# Patient Record
Sex: Female | Born: 1960 | Race: White | Hispanic: No | Smoking: Former smoker
Health system: Southern US, Community
[De-identification: ages and names within clinical notes are randomized; demographics above are authoritative.]

---

## 1999-07-12 ENCOUNTER — Encounter: Payer: Self-pay | Admitting: Gynecology

## 1999-07-12 ENCOUNTER — Other Ambulatory Visit: Admission: RE | Admit: 1999-07-12 | Discharge: 1999-07-12 | Payer: Self-pay | Admitting: Gynecology

## 1999-07-12 ENCOUNTER — Encounter: Admission: RE | Admit: 1999-07-12 | Discharge: 1999-07-12 | Payer: Self-pay | Admitting: Gynecology

## 1999-10-27 ENCOUNTER — Encounter: Payer: Self-pay | Admitting: *Deleted

## 1999-10-27 ENCOUNTER — Ambulatory Visit (HOSPITAL_COMMUNITY): Admission: RE | Admit: 1999-10-27 | Discharge: 1999-10-27 | Payer: Self-pay | Admitting: *Deleted

## 2003-02-24 ENCOUNTER — Other Ambulatory Visit: Admission: RE | Admit: 2003-02-24 | Discharge: 2003-02-24 | Payer: Self-pay | Admitting: Gynecology

## 2003-02-24 ENCOUNTER — Encounter: Admission: RE | Admit: 2003-02-24 | Discharge: 2003-02-24 | Payer: Self-pay | Admitting: Gynecology

## 2003-03-27 ENCOUNTER — Emergency Department (HOSPITAL_COMMUNITY): Admission: EM | Admit: 2003-03-27 | Discharge: 2003-03-27 | Payer: Self-pay | Admitting: Emergency Medicine

## 2005-04-20 ENCOUNTER — Other Ambulatory Visit: Admission: RE | Admit: 2005-04-20 | Discharge: 2005-04-20 | Payer: Self-pay | Admitting: Gynecology

## 2007-02-03 ENCOUNTER — Emergency Department (HOSPITAL_COMMUNITY): Admission: EM | Admit: 2007-02-03 | Discharge: 2007-02-03 | Payer: Self-pay | Admitting: Family Medicine

## 2007-02-28 ENCOUNTER — Ambulatory Visit: Payer: Self-pay | Admitting: Licensed Clinical Social Worker

## 2007-03-07 ENCOUNTER — Ambulatory Visit: Payer: Self-pay | Admitting: Licensed Clinical Social Worker

## 2007-03-14 ENCOUNTER — Ambulatory Visit: Payer: Self-pay | Admitting: Licensed Clinical Social Worker

## 2007-03-20 ENCOUNTER — Emergency Department (HOSPITAL_COMMUNITY): Admission: EM | Admit: 2007-03-20 | Discharge: 2007-03-20 | Payer: Self-pay | Admitting: Emergency Medicine

## 2007-03-20 ENCOUNTER — Ambulatory Visit: Payer: Self-pay | Admitting: Licensed Clinical Social Worker

## 2007-03-21 ENCOUNTER — Other Ambulatory Visit (HOSPITAL_COMMUNITY): Admission: RE | Admit: 2007-03-21 | Discharge: 2007-06-19 | Payer: Self-pay | Admitting: Psychiatry

## 2007-03-21 ENCOUNTER — Ambulatory Visit: Payer: Self-pay | Admitting: Psychiatry

## 2011-01-17 LAB — RAPID URINE DRUG SCREEN, HOSP PERFORMED
Amphetamines: NOT DETECTED
Barbiturates: NOT DETECTED
Benzodiazepines: POSITIVE — AB
Cocaine: NOT DETECTED
Opiates: NOT DETECTED
Tetrahydrocannabinol: NOT DETECTED

## 2011-01-17 LAB — BASIC METABOLIC PANEL
Calcium: 9.9
Chloride: 104
Creatinine, Ser: 0.81
GFR calc Af Amer: >60

## 2011-01-17 LAB — URINALYSIS, ROUTINE W REFLEX MICROSCOPIC
Bilirubin Urine: NEGATIVE
Nitrite: NEGATIVE
Specific Gravity, Urine: 1.005
pH: 6

## 2011-01-17 LAB — DIFFERENTIAL
Lymphocytes Relative: 30
Lymphs Abs: 3
Monocytes Relative: 7
Neutro Abs: 6.2
Neutrophils Relative %: 61

## 2011-01-17 LAB — ETHANOL: Alcohol, Ethyl (B): <5

## 2011-01-17 LAB — PREGNANCY, URINE: Preg Test, Ur: NEGATIVE

## 2011-01-17 LAB — TRICYCLICS SCREEN, URINE: TCA Scrn: NOT DETECTED

## 2011-01-17 LAB — CBC
RBC: 5.04
WBC: 10.1

## 2011-01-19 LAB — BASIC METABOLIC PANEL
CO2: 22
Calcium: 9.6
Chloride: 109
GFR calc Af Amer: >60
Sodium: 140

## 2011-01-19 LAB — CBC
Hemoglobin: 14
MCHC: 34.3
MCV: 90.3
RBC: 4.54

## 2011-01-19 LAB — URINALYSIS, ROUTINE W REFLEX MICROSCOPIC
Glucose, UA: NEGATIVE
Hgb urine dipstick: NEGATIVE
Specific Gravity, Urine: 1.004 — ABNORMAL LOW

## 2011-05-31 ENCOUNTER — Other Ambulatory Visit: Payer: Self-pay | Admitting: Obstetrics and Gynecology

## 2011-05-31 ENCOUNTER — Ambulatory Visit
Admission: RE | Admit: 2011-05-31 | Discharge: 2011-05-31 | Disposition: A | Discharge status: Home / Self Care | Payer: BC Managed Care – PPO | Source: Ambulatory Visit | Attending: Obstetrics and Gynecology | Admitting: Obstetrics and Gynecology

## 2011-05-31 DIAGNOSIS — R928 Other abnormal and inconclusive findings on diagnostic imaging of breast: Secondary | ICD-10-CM

## 2011-08-26 ENCOUNTER — Other Ambulatory Visit: Payer: Self-pay | Admitting: Family Medicine

## 2011-08-26 DIAGNOSIS — R42 Dizziness and giddiness: Secondary | ICD-10-CM

## 2011-08-27 ENCOUNTER — Other Ambulatory Visit: Payer: BC Managed Care – PPO

## 2016-09-09 DIAGNOSIS — E559 Vitamin D deficiency, unspecified: Secondary | ICD-10-CM | POA: Diagnosis not present

## 2016-09-09 DIAGNOSIS — Z0000 Encounter for general adult medical examination without abnormal findings: Secondary | ICD-10-CM | POA: Diagnosis not present

## 2016-09-14 DIAGNOSIS — Z0001 Encounter for general adult medical examination with abnormal findings: Secondary | ICD-10-CM | POA: Diagnosis not present

## 2016-09-14 DIAGNOSIS — M2550 Pain in unspecified joint: Secondary | ICD-10-CM | POA: Diagnosis not present

## 2016-09-14 DIAGNOSIS — R5382 Chronic fatigue, unspecified: Secondary | ICD-10-CM | POA: Diagnosis not present

## 2016-09-19 DIAGNOSIS — E0590 Thyrotoxicosis, unspecified without thyrotoxic crisis or storm: Secondary | ICD-10-CM | POA: Diagnosis not present

## 2016-09-23 DIAGNOSIS — M2550 Pain in unspecified joint: Secondary | ICD-10-CM | POA: Diagnosis not present

## 2016-09-23 DIAGNOSIS — H04129 Dry eye syndrome of unspecified lacrimal gland: Secondary | ICD-10-CM | POA: Diagnosis not present

## 2016-09-23 DIAGNOSIS — M79641 Pain in right hand: Secondary | ICD-10-CM | POA: Diagnosis not present

## 2016-09-23 DIAGNOSIS — M79672 Pain in left foot: Secondary | ICD-10-CM | POA: Diagnosis not present

## 2016-09-23 DIAGNOSIS — R5383 Other fatigue: Secondary | ICD-10-CM | POA: Diagnosis not present

## 2016-10-04 DIAGNOSIS — M1990 Unspecified osteoarthritis, unspecified site: Secondary | ICD-10-CM | POA: Diagnosis not present

## 2016-10-04 DIAGNOSIS — M25512 Pain in left shoulder: Secondary | ICD-10-CM | POA: Diagnosis not present

## 2016-10-04 DIAGNOSIS — M2550 Pain in unspecified joint: Secondary | ICD-10-CM | POA: Diagnosis not present

## 2016-10-04 DIAGNOSIS — R5383 Other fatigue: Secondary | ICD-10-CM | POA: Diagnosis not present

## 2016-10-08 DIAGNOSIS — Z1211 Encounter for screening for malignant neoplasm of colon: Secondary | ICD-10-CM | POA: Diagnosis not present

## 2016-10-08 DIAGNOSIS — Z1212 Encounter for screening for malignant neoplasm of rectum: Secondary | ICD-10-CM | POA: Diagnosis not present

## 2016-11-15 DIAGNOSIS — E0590 Thyrotoxicosis, unspecified without thyrotoxic crisis or storm: Secondary | ICD-10-CM | POA: Diagnosis not present

## 2016-11-30 DIAGNOSIS — G4710 Hypersomnia, unspecified: Secondary | ICD-10-CM | POA: Diagnosis not present

## 2016-11-30 DIAGNOSIS — R0602 Shortness of breath: Secondary | ICD-10-CM | POA: Diagnosis not present

## 2016-11-30 DIAGNOSIS — Z780 Asymptomatic menopausal state: Secondary | ICD-10-CM | POA: Diagnosis not present

## 2016-12-07 DIAGNOSIS — G4710 Hypersomnia, unspecified: Secondary | ICD-10-CM | POA: Diagnosis not present

## 2016-12-22 DIAGNOSIS — Z780 Asymptomatic menopausal state: Secondary | ICD-10-CM | POA: Diagnosis not present

## 2016-12-22 DIAGNOSIS — G4733 Obstructive sleep apnea (adult) (pediatric): Secondary | ICD-10-CM | POA: Diagnosis not present

## 2017-01-18 DIAGNOSIS — Z01419 Encounter for gynecological examination (general) (routine) without abnormal findings: Secondary | ICD-10-CM | POA: Diagnosis not present

## 2017-01-25 DIAGNOSIS — R5383 Other fatigue: Secondary | ICD-10-CM | POA: Diagnosis not present

## 2017-03-22 ENCOUNTER — Ambulatory Visit (INDEPENDENT_AMBULATORY_CARE_PROVIDER_SITE_OTHER): Payer: 59 | Admitting: Orthopedic Surgery

## 2017-03-22 ENCOUNTER — Ambulatory Visit (INDEPENDENT_AMBULATORY_CARE_PROVIDER_SITE_OTHER): Payer: 59

## 2017-03-22 ENCOUNTER — Encounter (INDEPENDENT_AMBULATORY_CARE_PROVIDER_SITE_OTHER): Payer: Self-pay | Admitting: Orthopedic Surgery

## 2017-03-22 DIAGNOSIS — M792 Neuralgia and neuritis, unspecified: Secondary | ICD-10-CM

## 2017-03-22 DIAGNOSIS — G8929 Other chronic pain: Secondary | ICD-10-CM

## 2017-03-22 DIAGNOSIS — M25511 Pain in right shoulder: Secondary | ICD-10-CM | POA: Diagnosis not present

## 2017-03-22 NOTE — Progress Notes (Signed)
Office Visit Note   Patient: Stacy Lawrence           Date of Birth: 1960-09-07           MRN: [id number] Visit Date: 03/22/2017 Requested by: No referring provider defined for this encounter. PCP: Deland Pretty, MD  Subjective: Chief Complaint  Patient presents with  . Right Shoulder - Pain    HPI: Stacy Lawrence is a 56 year old female with several year history of right shoulder pain.  She denies any discrete history of injury but does report that her symptoms started when she was working out with a Physiological scientist.  She has had 2 cortisone injections in the past.  One was last year which lasted for 8 months.  The second injection this year only lasted 2 months.  Patient states that the pain comes and goes.  She localizes it to the anterior deltoid region.  She denies any neck pain or any numbness and tingling but does state that the pain is improved at night by putting her arm in the overhead position.  She has had a course of physical therapy.  She has seen a rheumatologist.  She can go several days without pain but then she has pain for several days in a row.  He does hurt her with overhead motion.  She does do office type work.  She does report occasional popping in the shoulder              ROS: All systems reviewed are negative as they relate to the chief complaint within the history of present illness.  Patient denies  fevers or chills.   Assessment & Plan: Visit Diagnoses:  1. Chronic right shoulder pain   2. Radicular pain in right arm     Plan: Impression is right shoulder pain with positive O'Brien's testing but good strength and no evidence of frozen shoulder.  I think this may represent labral pathology or rotator cuff pathology.  Alternatively there is a small chance this could be early radiculopathy based on improvement with overhead placement at night.  She has had this for 2 years and has failed a course of conservative treatment.  Did have positive response on 2  occasions to shoulder injection.  Plan at this time is MRI arthrogram to evaluate the labrum biceps tendon and rotator cuff.  If that is negative we need to proceed with workup of the neck.  Follow-Up Instructions: Return for after MRI.   Orders:  Orders Placed This Encounter  Procedures  . XR Cervical Spine 2 or 3 views  . XR Shoulder Right  . Arthrogram  . MR SHOULDER RIGHT W CONTRAST   No orders of the defined types were placed in this encounter.     Procedures: No procedures performed   Clinical Data: No additional findings.  Objective: Vital Signs: There were no vitals taken for this visit.  Physical Exam:   Constitutional: Patient appears well-developed HEENT:  Head: Normocephalic Eyes:EOM are normal Neck: Normal range of motion Cardiovascular: Normal rate Pulmonary/chest: Effort normal Neurologic: Patient is alert Skin: Skin is warm Psychiatric: Patient has normal mood and affect    Ortho Exam: Patient has good cervical spine range of motion and no paresthesias in the arms.  Radial pulse intact bilaterally.  Patient has 5+ out of 5 grip EPL FPL interosseous wrist flexion wrist extension biceps triceps and deltoid strength.  No restriction of external rotation at 15 degrees of abduction.  No masses lymphadenopathy or  skin changes noted in the right shoulder girdle region.  Patient has excellent rotator cuff strength to isolated infraspinatus supraspinatus and subscap muscle testing.  Patient has full active and passive range of motion.  Specialty Comments:  No specialty comments available.  Imaging: Xr Cervical Spine 2 Or 3 Views  Result Date: 03/22/2017 AP lateral cervical spine reviewed.  Normal lordosis present.  No degenerative disc disease seen.  No facet arthritis present.  Normal cervical spine  Xr Shoulder Right  Result Date: 03/22/2017 AP outlet and axillary view right shoulder reviewed.  Visualized lung fields normal.  Acromioclavicular joint  normal.  Normal acromiohumeral distance present.  No arthritis seen.  Normal right shoulder    PMFS History: There are no active problems to display for this patient.  History reviewed. No pertinent past medical history.  History reviewed. No pertinent family history.  History reviewed. No pertinent surgical history. Social History   Occupational History  . Not on file  Tobacco Use  . Smoking status: Former Research scientist (life sciences)  . Smokeless tobacco: Never Used  Substance and Sexual Activity  . Alcohol use: Not on file  . Drug use: Not on file  . Sexual activity: Not on file

## 2017-04-20 ENCOUNTER — Ambulatory Visit
Admission: RE | Admit: 2017-04-20 | Discharge: 2017-04-20 | Disposition: A | Discharge status: Home / Self Care | Payer: 59 | Source: Ambulatory Visit | Attending: Orthopedic Surgery | Admitting: Orthopedic Surgery

## 2017-04-20 DIAGNOSIS — M25511 Pain in right shoulder: Secondary | ICD-10-CM | POA: Diagnosis not present

## 2017-04-20 DIAGNOSIS — S43431A Superior glenoid labrum lesion of right shoulder, initial encounter: Secondary | ICD-10-CM | POA: Diagnosis not present

## 2017-04-20 DIAGNOSIS — G8929 Other chronic pain: Secondary | ICD-10-CM

## 2017-04-20 MED ORDER — IOPAMIDOL (ISOVUE-M 200) INJECTION 41%
15.0000 mL | Freq: Once | INTRAMUSCULAR | Status: AC
  Administered 2017-04-20: 15 mL via INTRA_ARTICULAR

## 2017-04-25 ENCOUNTER — Telehealth (INDEPENDENT_AMBULATORY_CARE_PROVIDER_SITE_OTHER): Payer: Self-pay | Admitting: Orthopedic Surgery

## 2017-04-25 NOTE — Telephone Encounter (Signed)
Patient called asking if she could get a call with her MRI results. She didn't want to wait til next Wednesday for them. CB # [phone]

## 2017-04-25 NOTE — Telephone Encounter (Signed)
Please advise. Thanks.  

## 2017-04-26 ENCOUNTER — Telehealth (INDEPENDENT_AMBULATORY_CARE_PROVIDER_SITE_OTHER): Payer: Self-pay | Admitting: Orthopedic Surgery

## 2017-04-26 NOTE — Telephone Encounter (Signed)
Please advise. Thanks.  

## 2017-04-26 NOTE — Telephone Encounter (Signed)
Patient called again for MRI results.

## 2017-04-26 NOTE — Telephone Encounter (Signed)
Patient called saying she received Dr. Forbes Cellar voicemail with her results from her MRI but wanted to speak with him about her next options. CB # [phone]

## 2017-04-26 NOTE — Telephone Encounter (Signed)
I called left message

## 2017-04-27 ENCOUNTER — Telehealth (INDEPENDENT_AMBULATORY_CARE_PROVIDER_SITE_OTHER): Payer: Self-pay

## 2017-04-27 NOTE — Telephone Encounter (Signed)
Patient was returning Dr. Randel Pigg call concerning her MRI results.  Cb# is [phone].  Please advise. Thank you.

## 2017-04-27 NOTE — Telephone Encounter (Signed)
Please advise 

## 2017-04-28 NOTE — Telephone Encounter (Signed)
Please schedule her sometime in the future for shoulder injection intra-articular thanks

## 2017-04-28 NOTE — Telephone Encounter (Signed)
IC appt scheduled.  

## 2017-05-10 ENCOUNTER — Encounter (INDEPENDENT_AMBULATORY_CARE_PROVIDER_SITE_OTHER): Payer: Self-pay | Admitting: Orthopedic Surgery

## 2017-05-10 ENCOUNTER — Ambulatory Visit (INDEPENDENT_AMBULATORY_CARE_PROVIDER_SITE_OTHER): Payer: 59 | Admitting: Orthopedic Surgery

## 2017-05-10 DIAGNOSIS — S43431A Superior glenoid labrum lesion of right shoulder, initial encounter: Secondary | ICD-10-CM | POA: Diagnosis not present

## 2017-05-10 MED ORDER — BUPIVACAINE HCL 0.5 % IJ SOLN
9.0000 mL | INTRAMUSCULAR | Status: AC | PRN
  Administered 2017-05-10: 9 mL via INTRA_ARTICULAR

## 2017-05-10 MED ORDER — LIDOCAINE HCL 1 % IJ SOLN
5.0000 mL | INTRAMUSCULAR | Status: AC | PRN
  Administered 2017-05-10: 5 mL

## 2017-05-10 MED ORDER — METHYLPREDNISOLONE ACETATE 40 MG/ML IJ SUSP
40.0000 mg | INTRAMUSCULAR | Status: AC | PRN
  Administered 2017-05-10: 40 mg via INTRA_ARTICULAR

## 2017-05-10 NOTE — Progress Notes (Signed)
Office Visit Note   Patient: Stacy Lawrence           Date of Birth: Jul 01, 1960           MRN: [id number] Visit Date: 05/10/2017 Requested by: Deland Pretty, MD [address] [place], [place] [address] PCP: Deland Pretty, MD  Subjective: Chief Complaint  Patient presents with  . Right Shoulder - Follow-up    HPI: Patient presents for follow-up of right shoulder pain.  Had MRI scan earlier this year.  MRI scan shows SLAP tear and bursitis.  She is having some symptoms and mechanical symptoms in the shoulder when she tries to work out.  She has had 2 prior injections which sound like subacromial injections with marginal relief.  She is considering surgery versus another injection she is taking over-the-counter medications with some relief.              ROS: All systems reviewed are negative as they relate to the chief complaint within the history of present illness.  Patient denies  fevers or chills.   Assessment & Plan: Visit Diagnoses: No diagnosis found.  Plan: Impression is right shoulder pain with SLAP tear and mechanical symptoms on exam.  Plan is right shoulder injection today into the glenohumeral joint.  We will see how she does with symptoms.  I will see her back in 8 weeks for clinical recheck and decision for or against biceps tenodesis at that time.  The scan is reviewed with the patient and I discussed arthroscopic and open biceps tenodesis with her.  Follow-Up Instructions: No Follow-up on file.   Orders:  No orders of the defined types were placed in this encounter.  No orders of the defined types were placed in this encounter.     Procedures: Large Joint Inj: R glenohumeral on 05/10/2017 4:51 PM Indications: diagnostic evaluation and pain Details: 18 G 1.5 in needle, posterior approach  Arthrogram: No  Medications: 9 mL bupivacaine 0.5 %; 40 mg methylPREDNISolone acetate 40 MG/ML; 5 mL lidocaine 1 % Outcome: tolerated well, no immediate  complications Procedure, treatment alternatives, risks and benefits explained, specific risks discussed. Consent was given by the patient. Immediately prior to procedure a time out was called to verify the correct patient, procedure, equipment, support staff and site/side marked as required. Patient was prepped and draped in the usual sterile fashion.       Clinical Data: No additional findings.  Objective: Vital Signs: There were no vitals taken for this visit.  Physical Exam:   Constitutional: Patient appears well-developed HEENT:  Head: Normocephalic Eyes:EOM are normal Neck: Normal range of motion Cardiovascular: Normal rate Pulmonary/chest: Effort normal Neurologic: Patient is alert Skin: Skin is warm Psychiatric: Patient has normal mood and affect    Ortho Exam: Orthopedic exam demonstrates full active and passive range of motion of the shoulder with positive O'Brien's testing on the right along with negative speeds testing.  No discrete AC joint tenderness is present.  No other masses lymphadenopathy or skin changes noted in the right shoulder girdle region.  Rotator cuff strength is intact.  Specialty Comments:  No specialty comments available.  Imaging: No results found.   PMFS History: There are no active problems to display for this patient.  History reviewed. No pertinent past medical history.  History reviewed. No pertinent family history.  History reviewed. No pertinent surgical history. Social History   Occupational History  . Not on file  Tobacco Use  . Smoking status: Former  Smoker  . Smokeless tobacco: Never Used  Substance and Sexual Activity  . Alcohol use: Not on file  . Drug use: Not on file  . Sexual activity: Not on file

## 2017-06-05 ENCOUNTER — Telehealth (INDEPENDENT_AMBULATORY_CARE_PROVIDER_SITE_OTHER): Payer: Self-pay | Admitting: Orthopedic Surgery

## 2017-06-05 NOTE — Telephone Encounter (Signed)
Please advise injection done end of January and office note said return in 8 weeks to decide for/against surgery.

## 2017-06-05 NOTE — Telephone Encounter (Signed)
Patient called saying that the injection hasn't helped with the pain and was wondering what her next option is? Appointment/Surgery/etc? CB # [phone]

## 2017-06-06 NOTE — Telephone Encounter (Signed)
Patient called to check on message from yesterday, she said she would like to proceed with the surgery and needs to know whether to make an appt in office before the surgery is scheduled. Please advise # [phone]

## 2017-06-07 NOTE — Telephone Encounter (Signed)
Please review and advise. Thanks.  

## 2017-06-08 NOTE — Telephone Encounter (Signed)
I called do this.  She still is having shoulder pain.  Had an injection 05/10/2017.  Did not help much.  She does have a SLAP tear by MRI scan.  Also has some bursitis.  Plan at this time is arthroscopy with subacromial decompression and biceps tenodesis.  We talked about the rehab which would be essentially 2 weeks in a sling but with a CPM machine.  I would not want her doing any active resistive biceps work for at least 6 weeks.  I think she could return to work after a week.  All questions answered.

## 2017-06-08 NOTE — Telephone Encounter (Signed)
Surgery sheet given to [facility] to proceed with surgery.

## 2017-06-13 ENCOUNTER — Telehealth (INDEPENDENT_AMBULATORY_CARE_PROVIDER_SITE_OTHER): Payer: Self-pay | Admitting: Orthopedic Surgery

## 2017-06-13 NOTE — Telephone Encounter (Signed)
Please advise. Thanks.  

## 2017-06-13 NOTE — Telephone Encounter (Signed)
Patient called needing to know how long will she be out of work after the surgery. Patient want to know if she will be able to work from home the week after or the following week? Patient wanted to know how long will she need to be on the CPM machine. The number to contact patient is [phone]

## 2017-06-14 NOTE — Telephone Encounter (Signed)
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

## 2017-06-14 NOTE — Telephone Encounter (Signed)
IC s/w patient and advised. She verbalized understanding.  

## 2017-06-26 ENCOUNTER — Encounter: Payer: Self-pay | Admitting: Orthopedic Surgery

## 2017-06-26 DIAGNOSIS — S43431A Superior glenoid labrum lesion of right shoulder, initial encounter: Secondary | ICD-10-CM | POA: Diagnosis not present

## 2017-06-26 DIAGNOSIS — M7551 Bursitis of right shoulder: Secondary | ICD-10-CM | POA: Diagnosis not present

## 2017-06-26 DIAGNOSIS — G8918 Other acute postprocedural pain: Secondary | ICD-10-CM | POA: Diagnosis not present

## 2017-06-28 ENCOUNTER — Telehealth (INDEPENDENT_AMBULATORY_CARE_PROVIDER_SITE_OTHER): Payer: Self-pay | Admitting: Orthopedic Surgery

## 2017-06-28 NOTE — Telephone Encounter (Signed)
That is probably related to the block and it will likely wear off with a few more days possibly 1-2 weeks

## 2017-06-28 NOTE — Telephone Encounter (Signed)
Please advise. Thanks.  

## 2017-06-28 NOTE — Telephone Encounter (Signed)
Patient called stating that her hand is numb and wants to know if that is normal.  Also, I wanted to make sure that she needed a 1 week post op and not a 2 week.  Patient's CB#[phone].  Thank you

## 2017-06-28 NOTE — Telephone Encounter (Signed)
IC s/w patient and advised  

## 2017-07-05 ENCOUNTER — Ambulatory Visit (INDEPENDENT_AMBULATORY_CARE_PROVIDER_SITE_OTHER): Payer: 59 | Admitting: Orthopedic Surgery

## 2017-07-05 ENCOUNTER — Encounter (INDEPENDENT_AMBULATORY_CARE_PROVIDER_SITE_OTHER): Payer: Self-pay | Admitting: Orthopedic Surgery

## 2017-07-05 DIAGNOSIS — Z9889 Other specified postprocedural states: Secondary | ICD-10-CM

## 2017-07-05 NOTE — Progress Notes (Signed)
   Office Visit Note   Patient: Stacy Lawrence           Date of Birth: 13-May-1960           MRN: [id number] Visit Date: 07/05/2017 Requested by: Deland Pretty, MD [address] [place], [place] [address] PCP: Deland Pretty, MD  Subjective: Chief Complaint  Patient presents with  . Right Shoulder - Routine Post Op    HPI: Stacy Lawrence is a 57 year old patient who is now a week out right shoulder arthroscopy with biceps tenodesis.  CPM is on 80.  Passive range of motion looks good.  Sutures removed from the portals.  Plan is no lifting with that right arm biceps in particular.  Continue CPM for another 1/2 weeks.  Start physical therapy after that.  Okay to come out of the sling.  Prescription for therapy to start in 2 weeks given.  I will see her back in 3 weeks.  Patient generally is doing well.              ROS: See above  Assessment & Plan: Visit Diagnoses:  1. S/P shoulder surgery     Plan: See above  Follow-Up Instructions: Return in about 3 weeks (around 07/26/2017).   Orders:  Orders Placed This Encounter  Procedures  . Ambulatory referral to Physical Therapy   No orders of the defined types were placed in this encounter.     Procedures: No procedures performed   Clinical Data: No additional findings.  Objective: Vital Signs: There were no vitals taken for this visit.  Physical Exam: See above  Ortho Exam: See above  Specialty Comments:  No specialty comments available.  Imaging: No results found.   PMFS History: There are no active problems to display for this patient.  History reviewed. No pertinent past medical history.  History reviewed. No pertinent family history.  History reviewed. No pertinent surgical history. Social History   Occupational History  . Not on file  Tobacco Use  . Smoking status: Former Research scientist (life sciences)  . Smokeless tobacco: Never Used  Substance and Sexual Activity  . Alcohol use: Not on file  . Drug use: Not on file    . Sexual activity: Not on file

## 2017-07-17 DIAGNOSIS — S43431D Superior glenoid labrum lesion of right shoulder, subsequent encounter: Secondary | ICD-10-CM | POA: Diagnosis not present

## 2017-07-20 DIAGNOSIS — S43431D Superior glenoid labrum lesion of right shoulder, subsequent encounter: Secondary | ICD-10-CM | POA: Diagnosis not present

## 2017-07-25 DIAGNOSIS — S43431D Superior glenoid labrum lesion of right shoulder, subsequent encounter: Secondary | ICD-10-CM | POA: Diagnosis not present

## 2017-07-26 ENCOUNTER — Ambulatory Visit (INDEPENDENT_AMBULATORY_CARE_PROVIDER_SITE_OTHER): Payer: 59 | Admitting: Orthopedic Surgery

## 2017-07-27 ENCOUNTER — Ambulatory Visit (INDEPENDENT_AMBULATORY_CARE_PROVIDER_SITE_OTHER): Payer: 59 | Admitting: Orthopedic Surgery

## 2017-07-27 ENCOUNTER — Encounter (INDEPENDENT_AMBULATORY_CARE_PROVIDER_SITE_OTHER): Payer: Self-pay | Admitting: Orthopedic Surgery

## 2017-07-27 DIAGNOSIS — Z9889 Other specified postprocedural states: Secondary | ICD-10-CM

## 2017-07-27 DIAGNOSIS — S43431D Superior glenoid labrum lesion of right shoulder, subsequent encounter: Secondary | ICD-10-CM | POA: Diagnosis not present

## 2017-07-29 ENCOUNTER — Encounter (INDEPENDENT_AMBULATORY_CARE_PROVIDER_SITE_OTHER): Payer: Self-pay | Admitting: Orthopedic Surgery

## 2017-07-29 NOTE — Progress Notes (Signed)
   Post-Op Visit Note   Patient: Stacy Lawrence           Date of Birth: 1960-07-02           MRN: [id number] Visit Date: 07/27/2017 PCP: Deland Pretty, MD   Assessment & Plan:  Chief Complaint:  Chief Complaint  Patient presents with  . Right Shoulder - Follow-up   Visit Diagnoses:  1. S/P shoulder surgery     Plan: G this is a patient who is now about a month out from right shoulder surgery biceps tenodesis she is doing well.  She is making slow but steady progress with getting her range of motion back.  She is in physical therapy 2 times a week.  Also doing home exercise program 3 times a day.  She has returned to desk work.  Not taking any medications.  On examination she has forward flexion and abduction both above 90 degrees.  Biceps feels stable.  Plan is to continue therapy and to continue working on range of motion final check in 6 weeks  Follow-Up Instructions: Return in about 6 weeks (around 09/07/2017).   Orders:  No orders of the defined types were placed in this encounter.  No orders of the defined types were placed in this encounter.   Imaging: No results found.  PMFS History: There are no active problems to display for this patient.  History reviewed. No pertinent past medical history.  History reviewed. No pertinent family history.  History reviewed. No pertinent surgical history. Social History   Occupational History  . Not on file  Tobacco Use  . Smoking status: Former Research scientist (life sciences)  . Smokeless tobacco: Never Used  Substance and Sexual Activity  . Alcohol use: Not on file  . Drug use: Not on file  . Sexual activity: Not on file

## 2017-07-31 DIAGNOSIS — S43431D Superior glenoid labrum lesion of right shoulder, subsequent encounter: Secondary | ICD-10-CM | POA: Diagnosis not present

## 2017-08-04 DIAGNOSIS — S43431D Superior glenoid labrum lesion of right shoulder, subsequent encounter: Secondary | ICD-10-CM | POA: Diagnosis not present

## 2017-08-10 DIAGNOSIS — S43431D Superior glenoid labrum lesion of right shoulder, subsequent encounter: Secondary | ICD-10-CM | POA: Diagnosis not present

## 2017-08-15 DIAGNOSIS — S43431D Superior glenoid labrum lesion of right shoulder, subsequent encounter: Secondary | ICD-10-CM | POA: Diagnosis not present

## 2017-08-18 DIAGNOSIS — S43431D Superior glenoid labrum lesion of right shoulder, subsequent encounter: Secondary | ICD-10-CM | POA: Diagnosis not present

## 2017-08-22 DIAGNOSIS — S43431D Superior glenoid labrum lesion of right shoulder, subsequent encounter: Secondary | ICD-10-CM | POA: Diagnosis not present

## 2017-08-25 DIAGNOSIS — S43431D Superior glenoid labrum lesion of right shoulder, subsequent encounter: Secondary | ICD-10-CM | POA: Diagnosis not present

## 2017-08-29 DIAGNOSIS — S43431D Superior glenoid labrum lesion of right shoulder, subsequent encounter: Secondary | ICD-10-CM | POA: Diagnosis not present

## 2017-09-07 ENCOUNTER — Encounter (INDEPENDENT_AMBULATORY_CARE_PROVIDER_SITE_OTHER): Payer: Self-pay | Admitting: Orthopedic Surgery

## 2017-09-07 ENCOUNTER — Ambulatory Visit (INDEPENDENT_AMBULATORY_CARE_PROVIDER_SITE_OTHER): Payer: 59 | Admitting: Orthopedic Surgery

## 2017-09-07 DIAGNOSIS — Z9889 Other specified postprocedural states: Secondary | ICD-10-CM

## 2017-09-08 DIAGNOSIS — S43431D Superior glenoid labrum lesion of right shoulder, subsequent encounter: Secondary | ICD-10-CM | POA: Diagnosis not present

## 2017-09-09 ENCOUNTER — Encounter (INDEPENDENT_AMBULATORY_CARE_PROVIDER_SITE_OTHER): Payer: Self-pay | Admitting: Orthopedic Surgery

## 2017-09-09 NOTE — Progress Notes (Signed)
   Post-Op Visit Note   Patient: Stacy Lawrence           Date of Birth: [birth year]           MRN: [id number] Visit Date: 09/07/2017 PCP: Deland Pretty, MD   Assessment & Plan:  Chief Complaint:  Chief Complaint  Patient presents with  . Right Shoulder - Routine Post Op   Visit Diagnoses:  1. S/P shoulder surgery     Plan: Stacy Lawrence is about 10 weeks out right shoulder arthroscopy with biceps tenodesis.  She is doing weights and range of motion exercises and physical therapy.  She has occasional pain but overall she is doing well.  She has functional range of motion for activities of daily living.  I would like for her to continue therapy 2-4 more weeks.  I think she is doing well.  Follow-up with me as needed.  Follow-Up Instructions: Return if symptoms worsen or fail to improve.   Orders:  No orders of the defined types were placed in this encounter.  No orders of the defined types were placed in this encounter.   Imaging: No results found.  PMFS History: There are no active problems to display for this patient.  History reviewed. No pertinent past medical history.  History reviewed. No pertinent family history.  History reviewed. No pertinent surgical history. Social History   Occupational History  . Not on file  Tobacco Use  . Smoking status: Former Research scientist (life sciences)  . Smokeless tobacco: Never Used  Substance and Sexual Activity  . Alcohol use: Not on file  . Drug use: Not on file  . Sexual activity: Not on file

## 2017-09-12 DIAGNOSIS — S43431D Superior glenoid labrum lesion of right shoulder, subsequent encounter: Secondary | ICD-10-CM | POA: Diagnosis not present

## 2017-09-18 DIAGNOSIS — Z0000 Encounter for general adult medical examination without abnormal findings: Secondary | ICD-10-CM | POA: Diagnosis not present

## 2017-09-18 DIAGNOSIS — E559 Vitamin D deficiency, unspecified: Secondary | ICD-10-CM | POA: Diagnosis not present

## 2017-09-21 DIAGNOSIS — Z0001 Encounter for general adult medical examination with abnormal findings: Secondary | ICD-10-CM | POA: Diagnosis not present

## 2017-10-03 DIAGNOSIS — N950 Postmenopausal bleeding: Secondary | ICD-10-CM | POA: Diagnosis not present

## 2017-10-11 DIAGNOSIS — N950 Postmenopausal bleeding: Secondary | ICD-10-CM | POA: Diagnosis not present

## 2017-10-11 DIAGNOSIS — N83201 Unspecified ovarian cyst, right side: Secondary | ICD-10-CM | POA: Diagnosis not present

## 2017-10-11 DIAGNOSIS — N858 Other specified noninflammatory disorders of uterus: Secondary | ICD-10-CM | POA: Diagnosis not present

## 2017-10-19 ENCOUNTER — Encounter (INDEPENDENT_AMBULATORY_CARE_PROVIDER_SITE_OTHER): Payer: Self-pay | Admitting: Orthopedic Surgery

## 2017-10-19 ENCOUNTER — Encounter (INDEPENDENT_AMBULATORY_CARE_PROVIDER_SITE_OTHER): Payer: Self-pay | Admitting: Orthopaedic Surgery

## 2018-01-23 DIAGNOSIS — Z01419 Encounter for gynecological examination (general) (routine) without abnormal findings: Secondary | ICD-10-CM | POA: Diagnosis not present

## 2018-01-23 DIAGNOSIS — Z6826 Body mass index (BMI) 26.0-26.9, adult: Secondary | ICD-10-CM | POA: Diagnosis not present

## 2018-01-29 DIAGNOSIS — L821 Other seborrheic keratosis: Secondary | ICD-10-CM | POA: Diagnosis not present

## 2018-01-29 DIAGNOSIS — D225 Melanocytic nevi of trunk: Secondary | ICD-10-CM | POA: Diagnosis not present

## 2018-01-29 DIAGNOSIS — L814 Other melanin hyperpigmentation: Secondary | ICD-10-CM | POA: Diagnosis not present

## 2018-02-27 DIAGNOSIS — N393 Stress incontinence (female) (male): Secondary | ICD-10-CM | POA: Diagnosis not present

## 2018-02-27 DIAGNOSIS — N390 Urinary tract infection, site not specified: Secondary | ICD-10-CM | POA: Diagnosis not present

## 2018-03-05 DIAGNOSIS — N950 Postmenopausal bleeding: Secondary | ICD-10-CM | POA: Diagnosis not present

## 2018-03-05 DIAGNOSIS — N939 Abnormal uterine and vaginal bleeding, unspecified: Secondary | ICD-10-CM | POA: Diagnosis not present

## 2018-05-16 DIAGNOSIS — R197 Diarrhea, unspecified: Secondary | ICD-10-CM | POA: Diagnosis not present

## 2018-05-16 DIAGNOSIS — R143 Flatulence: Secondary | ICD-10-CM | POA: Diagnosis not present

## 2018-07-16 DIAGNOSIS — H00014 Hordeolum externum left upper eyelid: Secondary | ICD-10-CM | POA: Diagnosis not present

## 2019-03-26 IMAGING — MR MR SHOULDER*R* W/CM
6 series · 40 of 40 positions shown · IV contrast (agent unspecified)
Comparison: None.

CLINICAL DATA: Chronic global right shoulder pain.

EXAM:
MR ARTHROGRAM OF THE  SHOULDER
TECHNIQUE: Multiplanar, multisequence MR imaging of the right shoulder was
performed following the administration of intra-articular contrast.
CONTRAST:  See Injection Documentation.

[Series 3: T1 fat-sat · axial · 4.0mm · 0.23mm/px · z∈[-42,+38]mm · 8 of 18 slices shown (1 of 4)]
[im 1/18]
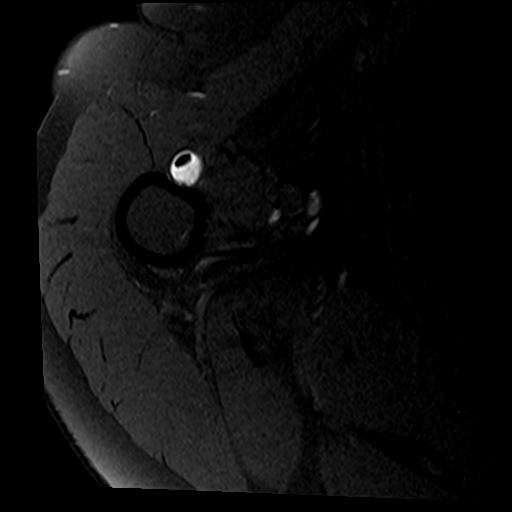
[im 3/18]
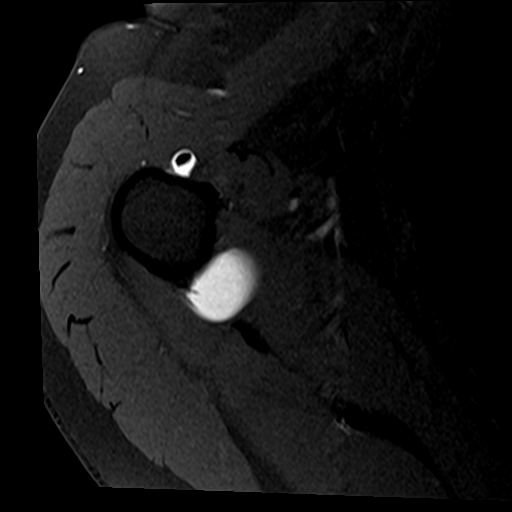
[im 5/18]
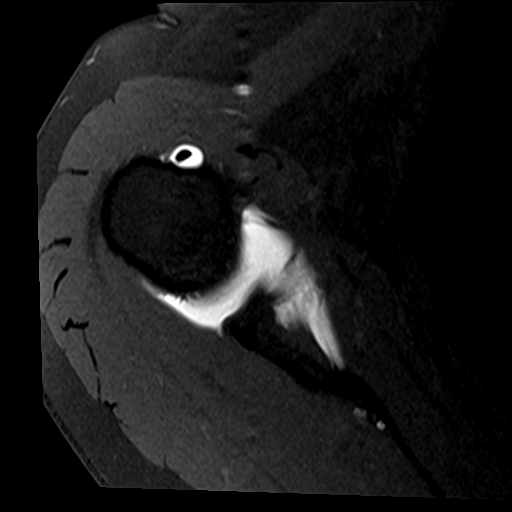
[im 8/18]
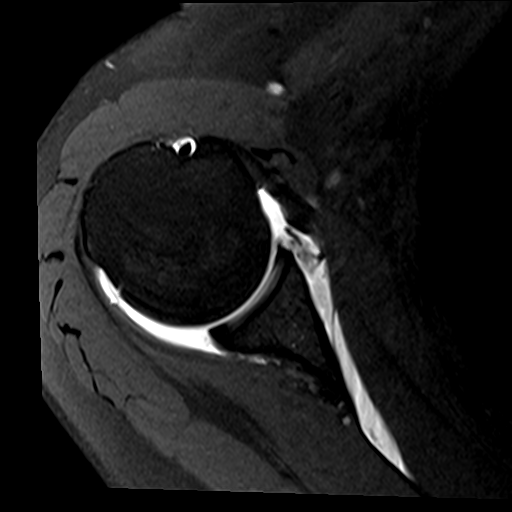
[im 10/18]
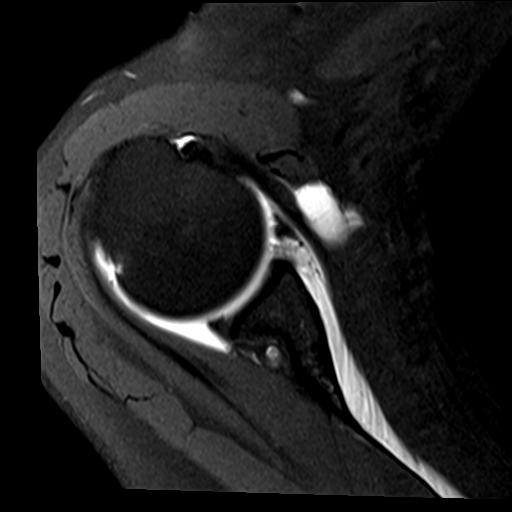
[im 13/18]
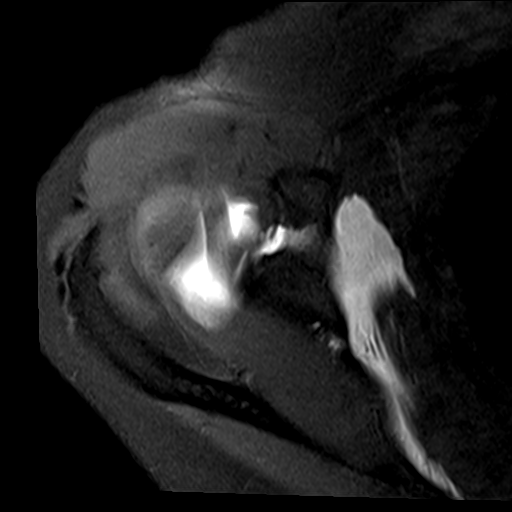
[im 15/18]
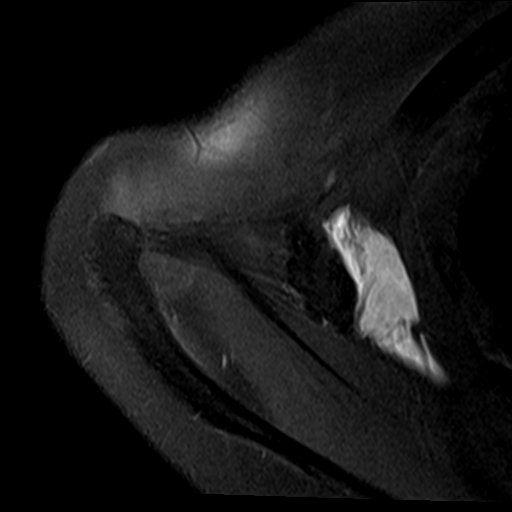
[im 18/18]
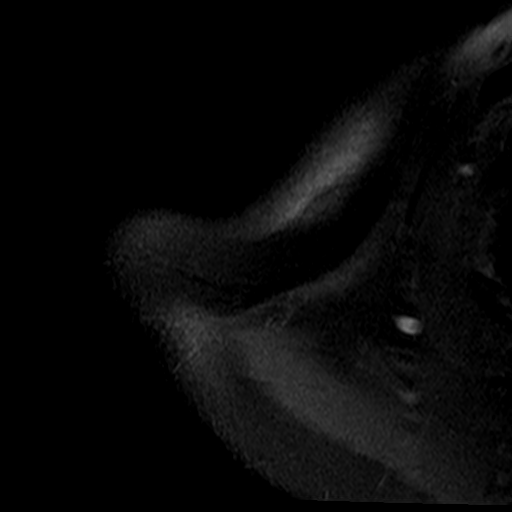

[Series 4: T2 fat-sat · oblique · 4.0mm · 0.55mm/px · 8 of 17 slices shown (1 of 2)]
[im 1/17]
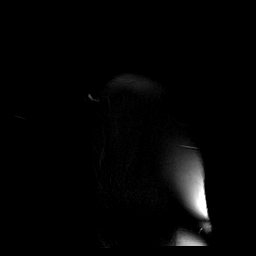
[im 3/17]
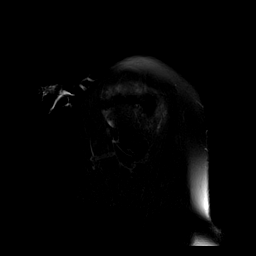
[im 5/17]
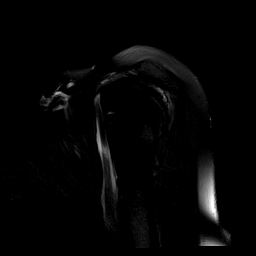
[im 7/17]
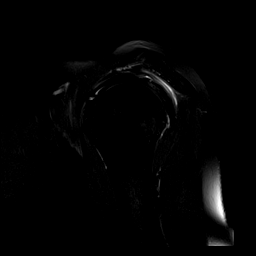
[im 10/17]
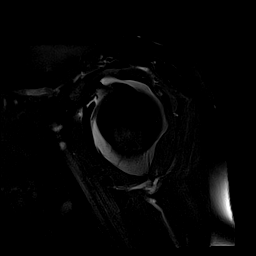
[im 12/17]
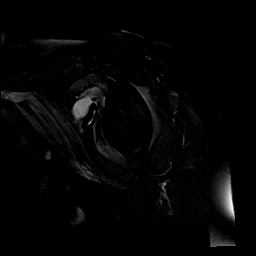
[im 14/17]
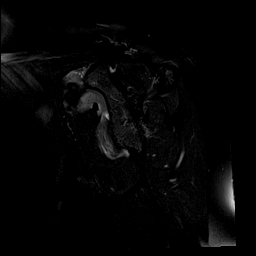
[im 17/17]
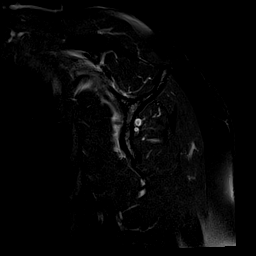

[Series 5: T1 fat-sat · oblique · 4.0mm · 0.44mm/px · 6 of 16 slices shown (2 of 4)]
[im 1/16]
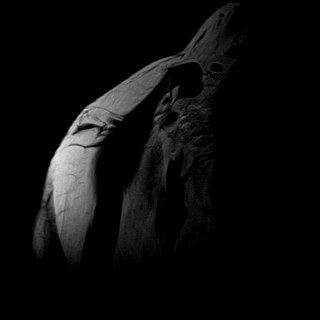
[im 4/16]
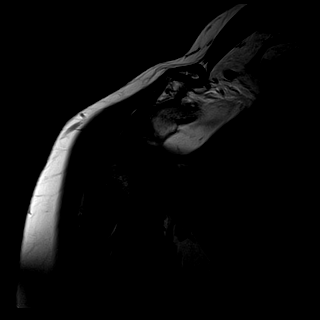
[im 7/16]
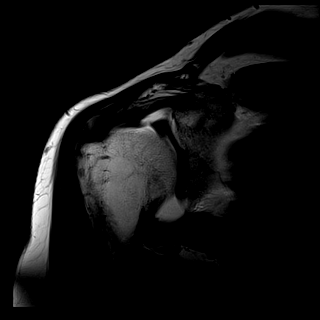
[im 10/16]
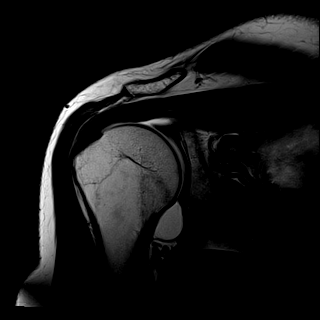
[im 13/16]
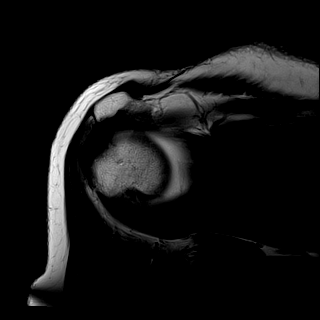
[im 16/16]
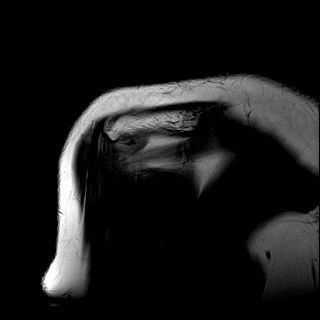

[Series 6: T1 fat-sat · oblique · 4.0mm · 0.55mm/px · 6 of 16 slices shown (3 of 4)]
[im 1/16]
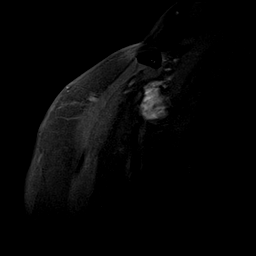
[im 4/16]
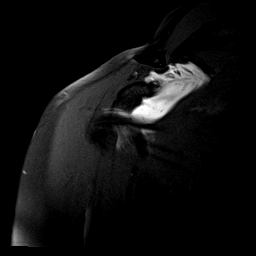
[im 7/16]
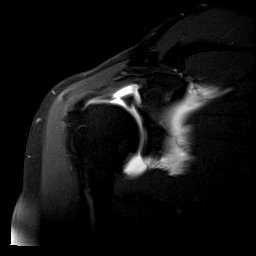
[im 10/16]
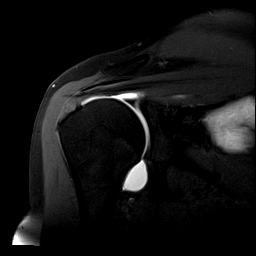
[im 13/16]
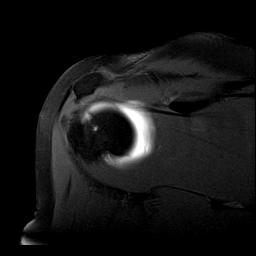
[im 16/16]
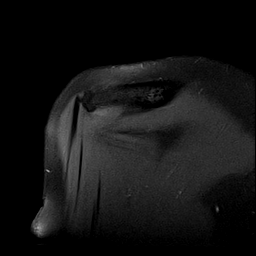

[Series 7: T2 fat-sat · oblique · 4.0mm · 0.55mm/px · 6 of 16 slices shown (2 of 2)]
[im 1/16]
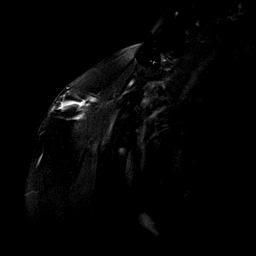
[im 4/16]
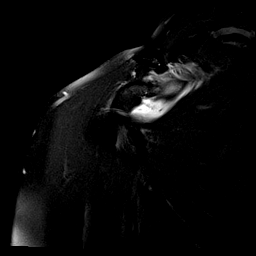
[im 7/16]
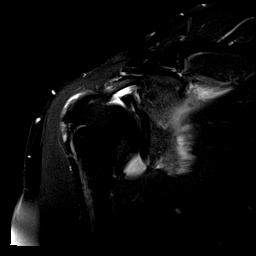
[im 10/16]
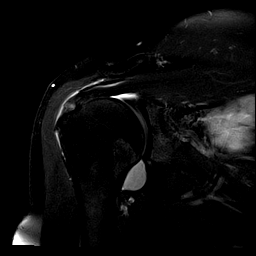
[im 13/16]
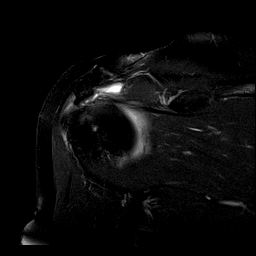
[im 16/16]
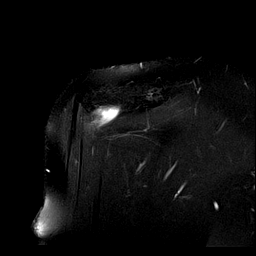

[Series 10: T1 fat-sat · sagittal · 4.0mm · 0.59mm/px · 6 of 16 slices shown (4 of 4)]
[im 1/16]
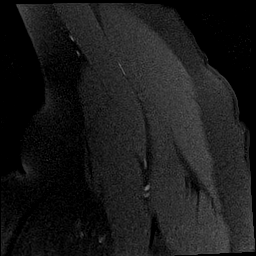
[im 4/16]
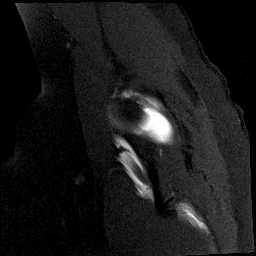
[im 7/16]
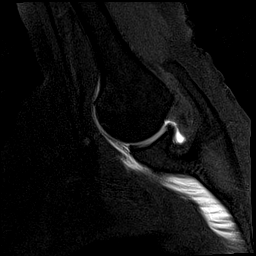
[im 10/16]
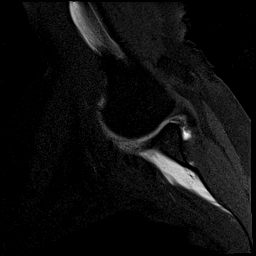
[im 13/16]
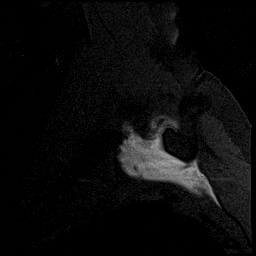
[im 16/16]
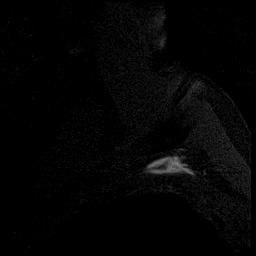

[40 of 40 positions shown; findings below may reference images not displayed]

FINDINGS: Rotator cuff: Tendinosis of the infraspinatus and supraspinatus
tendon with articular surface footplate tear of the supraspinatus,
series 7, images 6 and 7. The subscapularis and teres minor are
intact.

Muscles: Imbibing of extra-articular contrast is seen involving the
subscapularis. No muscle atrophy.

Biceps long head: Intact

Acromioclavicular Joint: There is some lateral downsloping of the
acromion with subcortical cystic change along the posterolateral
humeral head consistent with changes of mild posterior impingement.
No significant AC joint arthropathy. Subacromial and subdeltoid
bursal fluid consistent with bursitis. Type 1 acromial shape.

Glenohumeral Joint: No focal chondral defects.

Labrum: Slap tear of the glenoid labrum with fraying of the anterior
labrum, series 3, image 7, series 6 image 8 and 9.

Bones: No fracture or bone contusions. No joint dislocations or
suspicious osseous lesions.
IMPRESSION: 1. SLAP tear of the glenoid labrum with fraying of the anterior
labrum.
2. Infraspinatus and supraspinatus tendinosis with articular surface
footplate partial tear of the supraspinatus.
3. Slight lateral downsloping of the acromion with minimal
posterolateral subcortical cystic change of the humeral head that
may reflect stigmata of posterior impingement.
4. Subacromial and subdeltoid bursitis.

## 2019-06-14 ENCOUNTER — Ambulatory Visit: Payer: Self-pay | Attending: Internal Medicine

## 2019-06-14 DIAGNOSIS — Z23 Encounter for immunization: Secondary | ICD-10-CM | POA: Insufficient documentation

## 2019-06-14 NOTE — Progress Notes (Signed)
   Covid-19 Vaccination Clinic  Name:  Stacy Lawrence    MRN: [id number] DOB: 1960-08-08  06/14/2019  Stacy Lawrence was observed post Covid-19 immunization for 15 minutes without incident. She was provided with Vaccine Information Sheet and instruction to access the V-Safe system.   Stacy Lawrence was instructed to call 911 with any severe reactions post vaccine: Marland Kitchen Difficulty breathing  . Swelling of face and throat  . A fast heartbeat  . A bad rash all over body  . Dizziness and weakness   Immunizations Administered    Name Date Dose VIS Date Route   Pfizer COVID-19 Vaccine 06/14/2019  6:35 PM 0.3 mL 03/22/2019 Intramuscular   Manufacturer: [place]   Lot: [id number]   [place]: [id number]

## 2019-07-16 ENCOUNTER — Ambulatory Visit: Payer: Self-pay | Attending: Internal Medicine

## 2019-07-16 DIAGNOSIS — Z23 Encounter for immunization: Secondary | ICD-10-CM

## 2019-07-16 NOTE — Progress Notes (Signed)
   Covid-19 Vaccination Clinic  Name:  Stacy Lawrence    MRN: [id number] DOB: 1960-09-27  07/16/2019  [name] was observed post Covid-19 immunization for 15 minutes without incident. She was provided with Vaccine Information Sheet and instruction to access the V-Safe system.   [name] was instructed to call 911 with any severe reactions post vaccine: Marland Kitchen Difficulty breathing  . Swelling of face and throat  . A fast heartbeat  . A bad rash all over body  . Dizziness and weakness   Immunizations Administered    Name Date Dose VIS Date Route   Pfizer COVID-19 Vaccine 07/16/2019  8:34 AM 0.3 mL 03/22/2019 Intramuscular   Manufacturer: [employer], [employer]   Lot: [id number]   [place]: [id number]

## 2022-04-28 DIAGNOSIS — N3000 Acute cystitis without hematuria: Secondary | ICD-10-CM | POA: Diagnosis not present

## 2022-04-28 DIAGNOSIS — R350 Frequency of micturition: Secondary | ICD-10-CM | POA: Diagnosis not present

## 2022-07-05 DIAGNOSIS — F419 Anxiety disorder, unspecified: Secondary | ICD-10-CM | POA: Diagnosis not present

## 2022-07-05 DIAGNOSIS — F330 Major depressive disorder, recurrent, mild: Secondary | ICD-10-CM | POA: Diagnosis not present

## 2022-07-05 DIAGNOSIS — F900 Attention-deficit hyperactivity disorder, predominantly inattentive type: Secondary | ICD-10-CM | POA: Diagnosis not present

## 2022-07-05 DIAGNOSIS — E7800 Pure hypercholesterolemia, unspecified: Secondary | ICD-10-CM | POA: Diagnosis not present

## 2022-09-15 DIAGNOSIS — Z23 Encounter for immunization: Secondary | ICD-10-CM | POA: Diagnosis not present

## 2022-12-28 DIAGNOSIS — Z1231 Encounter for screening mammogram for malignant neoplasm of breast: Secondary | ICD-10-CM | POA: Diagnosis not present

## 2022-12-28 DIAGNOSIS — Z01419 Encounter for gynecological examination (general) (routine) without abnormal findings: Secondary | ICD-10-CM | POA: Diagnosis not present

## 2022-12-28 DIAGNOSIS — Z6824 Body mass index (BMI) 24.0-24.9, adult: Secondary | ICD-10-CM | POA: Diagnosis not present

## 2022-12-29 ENCOUNTER — Other Ambulatory Visit: Payer: Self-pay | Admitting: Obstetrics and Gynecology

## 2022-12-29 DIAGNOSIS — Z720 Tobacco use: Secondary | ICD-10-CM

## 2023-01-13 DIAGNOSIS — F330 Major depressive disorder, recurrent, mild: Secondary | ICD-10-CM | POA: Diagnosis not present

## 2023-01-13 DIAGNOSIS — Z0000 Encounter for general adult medical examination without abnormal findings: Secondary | ICD-10-CM | POA: Diagnosis not present

## 2023-01-13 DIAGNOSIS — F900 Attention-deficit hyperactivity disorder, predominantly inattentive type: Secondary | ICD-10-CM | POA: Diagnosis not present

## 2023-01-13 DIAGNOSIS — E7800 Pure hypercholesterolemia, unspecified: Secondary | ICD-10-CM | POA: Diagnosis not present

## 2023-01-13 DIAGNOSIS — F419 Anxiety disorder, unspecified: Secondary | ICD-10-CM | POA: Diagnosis not present

## 2023-02-17 ENCOUNTER — Encounter: Payer: Self-pay | Admitting: Obstetrics and Gynecology

## 2024-01-02 DIAGNOSIS — Z6825 Body mass index (BMI) 25.0-25.9, adult: Secondary | ICD-10-CM | POA: Diagnosis not present

## 2024-01-02 DIAGNOSIS — Z1321 Encounter for screening for nutritional disorder: Secondary | ICD-10-CM | POA: Diagnosis not present

## 2024-01-02 DIAGNOSIS — Z01419 Encounter for gynecological examination (general) (routine) without abnormal findings: Secondary | ICD-10-CM | POA: Diagnosis not present

## 2024-01-02 DIAGNOSIS — Z124 Encounter for screening for malignant neoplasm of cervix: Secondary | ICD-10-CM | POA: Diagnosis not present

## 2024-01-02 DIAGNOSIS — Z1151 Encounter for screening for human papillomavirus (HPV): Secondary | ICD-10-CM | POA: Diagnosis not present

## 2024-01-02 DIAGNOSIS — Z13220 Encounter for screening for lipoid disorders: Secondary | ICD-10-CM | POA: Diagnosis not present

## 2024-01-02 DIAGNOSIS — Z1329 Encounter for screening for other suspected endocrine disorder: Secondary | ICD-10-CM | POA: Diagnosis not present

## 2024-01-02 DIAGNOSIS — Z131 Encounter for screening for diabetes mellitus: Secondary | ICD-10-CM | POA: Diagnosis not present

## 2024-01-03 ENCOUNTER — Other Ambulatory Visit: Payer: Self-pay | Admitting: Obstetrics and Gynecology

## 2024-01-03 DIAGNOSIS — Z720 Tobacco use: Secondary | ICD-10-CM

## 2024-01-11 ENCOUNTER — Other Ambulatory Visit: Payer: Self-pay | Admitting: Obstetrics and Gynecology

## 2024-01-11 ENCOUNTER — Encounter: Payer: Self-pay | Admitting: Obstetrics and Gynecology

## 2024-01-15 ENCOUNTER — Ambulatory Visit
Admission: RE | Admit: 2024-01-15 | Discharge: 2024-01-15 | Disposition: A | Discharge status: Home / Self Care | Source: Ambulatory Visit | Attending: Obstetrics and Gynecology | Admitting: Obstetrics and Gynecology

## 2024-01-15 DIAGNOSIS — Z720 Tobacco use: Secondary | ICD-10-CM

## 2024-01-15 DIAGNOSIS — Z87891 Personal history of nicotine dependence: Secondary | ICD-10-CM | POA: Diagnosis not present

## 2024-01-16 DIAGNOSIS — R748 Abnormal levels of other serum enzymes: Secondary | ICD-10-CM | POA: Diagnosis not present

## 2024-01-16 DIAGNOSIS — D649 Anemia, unspecified: Secondary | ICD-10-CM | POA: Diagnosis not present

## 2024-01-21 NOTE — Progress Notes (Unsigned)
 [facility] CONSULT NOTE  Patient Care Team: Clarice Nottingham, MD as PCP - General (Internal Medicine)  ASSESSMENT & PLAN:  Stacy Lawrence is a 63 y.o. female with history of HLD, depression, anxiety being seen for iron deficiency anemia.  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  She has a large hiatal hernia but denies any melena, or dark stool before taking iron.  She has follow-up with general surgery for evaluation.  I agree with the referral and follow-up recommendation.  She tolerated oral iron supplement fairly well over the past week without side effects.  She has felt a little better.  Therefore, we will postpone IV iron for now.  Short-term follow-up in about 2 months.  Assessment & Plan Iron deficiency anemia secondary to inadequate dietary iron intake CBC, Iron, TIBC, ferritin Iron supplement twice daily Follow-up general surgery  Orders Placed This Encounter  Procedures   CBC with Differential (Cancer Center Only)    Standing Status:   Future    Expiration Date:   01/21/2025   Iron and Iron Binding Capacity ([facility] only)    Standing Status:   Future    Expiration Date:   01/21/2025   Ferritin    Standing Status:   Future    Expiration Date:   01/21/2025   All questions were answered. The patient knows to call the clinic with any problems, questions or concerns.  Pauletta JAYSON Chihuahua, MD 10/13/20252:49 PM   CHIEF COMPLAINTS/PURPOSE OF CONSULTATION:  Anemia  HISTORY OF PRESENTING ILLNESS:  Stacy Lawrence 63 y.o. female is here because of anemia.  01/01/22 hgb 10.8. MCV 82 RDW 16 Report she took oral iron and no side effects and stopped after numbers improved.  01/16/24 hgb 12 MCV 75. RDW 17 iron saturation 5% Ferritin 3.7  Kiyah had not noticed any recent bleeding such as melena, hematuria or hematochezia. Her last colonoscopy was 2021 and  due in 2026. No thin stool or caliber change.  She was started on iron 325 mg daily on 10/7. Stool turned dark after that.  Vaginal bleeding: no.  No bloody urine.  She had no family history of GI or colon cancer.   She denies any pica and eats a variety of diet.  She denies blood donation or received blood transfusion.  She eats fruits, meat and vegetables.  Appetite is good.  MEDICAL HISTORY:  No past medical history on file.  SURGICAL HISTORY: No past surgical history on file.  SOCIAL HISTORY: Social History   Socioeconomic History   Marital status: Divorced    Spouse name: Not on file   Number of children: Not on file   Years of education: Not on file   Highest education level: Not on file  Occupational History   Not on file  Tobacco Use   Smoking status: Former   Smokeless tobacco: Never  Substance and Sexual Activity   Alcohol use: Not on file   Drug use: Not on file   Sexual activity: Not on file  Other Topics Concern   Not on file  Social History Narrative   Not on file   Social Drivers of Health   Financial Resource Strain: Low Risk  (01/22/2024)   Overall Financial Resource Strain (CARDIA)    Difficulty of Paying Living Expenses: Not hard at all  Food Insecurity: No Food Insecurity (01/22/2024)   Hunger Vital Sign  Worried About Programme researcher, broadcasting/film/video in the Last Year: Never true    Ran Out of Food in the Last Year: Never true  Transportation Needs: No Transportation Needs (01/22/2024)   PRAPARE - Administrator, Civil Service (Medical): No    Lack of Transportation (Non-Medical): No  Physical Activity: Not on file  Stress: No Stress Concern Present (01/22/2024)   [employer] of Occupational Health - Occupational Stress Questionnaire    Feeling of Stress: Not at all  Social Connections: Unknown (01/23/2022)   Received from [facility]   Social Network    Social Network: Not on file  Intimate Partner Violence: Not At Risk  (01/22/2024)   Humiliation, Afraid, Rape, and Kick questionnaire    Fear of Current or Ex-Partner: No    Emotionally Abused: No    Physically Abused: No    Sexually Abused: No    FAMILY HISTORY: No family history on file.  ALLERGIES:  has no known allergies.  MEDICATIONS:  Current Outpatient Medications  Medication Sig Dispense Refill   ALPRAZolam (XANAX) 0.5 MG tablet Take 0.5 mg by mouth 3 (three) times daily as needed for anxiety.     Biotin 5000 MCG CAPS Take 5,000 mcg by mouth daily.     Calcium Carb-Cholecalciferol 600-5 MG-MCG TABS Take 1 tablet by mouth 1 day or 1 dose.     escitalopram (LEXAPRO) 20 MG tablet Take 20 mg by mouth daily.     Multiple Vitamins-Minerals (MULTI FOR HER 50+) TABS Take 1 tablet by mouth 1 day or 1 dose.     naproxen sodium (ALEVE) 220 MG tablet Take 220 mg by mouth 2 (two) times daily as needed.     rosuvastatin (CRESTOR) 10 MG tablet Take 10 mg by mouth daily.     No current facility-administered medications for this visit.    REVIEW OF SYSTEMS:   All relevant systems were reviewed with the patient and are negative.  PHYSICAL EXAMINATION:  Vitals:   01/22/24 1246  BP: 109/66  Pulse: 73  Resp: 16  Temp: (!) 97.3 F (36.3 C)  SpO2: 100%   Filed Weights   01/22/24 1246  Weight: 148 lb (67.1 kg)    GENERAL: alert, no distress and comfortable SKIN: skin color normal EYES: normal conjunctiva, sclera clear LUNGS: normal breathing effort ABDOMEN: abdomen soft, non-tender and nondistended  RADIOGRAPHIC STUDIES: I have personally reviewed the radiological images as listed and agreed with the findings in the report. CT CHEST LUNG CA SCREEN LOW DOSE W/O CM Result Date: 01/17/2024 CLINICAL DATA:  Twenty pack-year former smoker, quit 9 years ago. EXAM: CT CHEST WITHOUT CONTRAST LOW-DOSE FOR LUNG CANCER SCREENING TECHNIQUE: Multidetector CT imaging of the chest was performed following the standard protocol without IV contrast. RADIATION DOSE  REDUCTION: This exam was performed according to the departmental dose-optimization program which includes automated exposure control, adjustment of the mA and/or kV according to patient size and/or use of iterative reconstruction technique. COMPARISON:  None Available. FINDINGS: Cardiovascular: Atherosclerotic calcification of the aorta, aortic valve and coronary arteries. Heart size normal. No pericardial effusion. Mediastinum/Nodes: No pathologically enlarged mediastinal or axillary lymph nodes. Hilar regions are difficult to definitively evaluate without IV contrast. Esophagus is grossly unremarkable. Lungs/Pleura: Centrilobular and paraseptal emphysema. 2.3 mm right upper lobe nodule. No suspicious pulmonary nodules. No pleural fluid. Airway is unremarkable. Upper Abdomen: Large hiatal hernia contains the entirety of the stomach. Visualized portions of the liver, gallbladder, adrenal glands, kidneys, spleen, pancreas, stomach and  bowel are otherwise grossly unremarkable. No upper abdominal adenopathy. Musculoskeletal: Degenerative changes in the spine. IMPRESSION: 1. Lung-RADS 2, benign appearance or behavior. Continue annual screening with low-dose chest CT without contrast in 12 months. 2. Large hiatal hernia contains the entirety of the stomach. 3. Aortic atherosclerosis (ICD10-I70.0). Coronary artery calcification. 4.  Emphysema (ICD10-J43.9). Electronically Signed   By: Newell Eke M.D.   On: 01/17/2024 11:05

## 2024-01-22 ENCOUNTER — Inpatient Hospital Stay

## 2024-01-22 ENCOUNTER — Other Ambulatory Visit

## 2024-01-22 VITALS — BP 109/66 | HR 73 | Temp 97.3°F | Resp 16 | Ht 64.0 in | Wt 148.0 lb

## 2024-01-22 DIAGNOSIS — D508 Other iron deficiency anemias: Secondary | ICD-10-CM | POA: Diagnosis not present

## 2024-02-14 DIAGNOSIS — K449 Diaphragmatic hernia without obstruction or gangrene: Secondary | ICD-10-CM | POA: Diagnosis not present

## 2024-02-22 DIAGNOSIS — E611 Iron deficiency: Secondary | ICD-10-CM | POA: Diagnosis not present

## 2024-02-22 DIAGNOSIS — Z860100 Personal history of colon polyps, unspecified: Secondary | ICD-10-CM | POA: Diagnosis not present

## 2024-02-22 DIAGNOSIS — Z83719 Family history of colon polyps, unspecified: Secondary | ICD-10-CM | POA: Diagnosis not present

## 2024-03-19 DIAGNOSIS — D509 Iron deficiency anemia, unspecified: Secondary | ICD-10-CM | POA: Diagnosis not present

## 2024-03-19 DIAGNOSIS — K649 Unspecified hemorrhoids: Secondary | ICD-10-CM | POA: Diagnosis not present

## 2024-03-19 DIAGNOSIS — K3189 Other diseases of stomach and duodenum: Secondary | ICD-10-CM | POA: Diagnosis not present

## 2024-03-19 DIAGNOSIS — Z09 Encounter for follow-up examination after completed treatment for conditions other than malignant neoplasm: Secondary | ICD-10-CM | POA: Diagnosis not present

## 2024-03-19 DIAGNOSIS — Z860101 Personal history of adenomatous and serrated colon polyps: Secondary | ICD-10-CM | POA: Diagnosis not present

## 2024-03-19 DIAGNOSIS — K449 Diaphragmatic hernia without obstruction or gangrene: Secondary | ICD-10-CM | POA: Diagnosis not present

## 2024-03-19 DIAGNOSIS — K5730 Diverticulosis of large intestine without perforation or abscess without bleeding: Secondary | ICD-10-CM | POA: Diagnosis not present

## 2024-03-21 ENCOUNTER — Inpatient Hospital Stay

## 2024-03-21 DIAGNOSIS — D508 Other iron deficiency anemias: Secondary | ICD-10-CM | POA: Insufficient documentation

## 2024-03-22 NOTE — Progress Notes (Deleted)
 [facility] OFFICE PROGRESS NOTE  Patient Care Team: Clarice Nottingham, MD as PCP - General (Internal Medicine)  Stacy Lawrence is a 63 y.o. female with history of HLD, depression, anxiety being seen for iron deficiency anemia.   Relevant history: History of large hiatal hernia Last colonoscopy: 2021 Last EGD: None recently   Her iron deficiency likely related to chronic malabsorption.  She has a large hiatal hernia but denies any melena, or dark stool before taking iron.  She has follow-up with general surgery for evaluation.  I agree with the referral and follow-up recommendation.  She tolerated oral iron supplement fairly well over the past week without side effects.  She has felt a little better.  Therefore, we will postpone IV iron for now.  Short-term follow-up in about 2 months. Assessment & Plan   No orders of the defined types were placed in this encounter.    Pauletta JAYSON Chihuahua, MD  INTERVAL HISTORY: Patient returns for follow-up.  Oncology History   No problem history exists.     PHYSICAL EXAMINATION: ECOG PERFORMANCE STATUS: {CHL ONC ECOG PS:[id number]}  There were no vitals filed for this visit. There were no vitals filed for this visit.  Relevant data reviewed during this visit included ***

## 2024-03-25 ENCOUNTER — Inpatient Hospital Stay

## 2024-03-28 ENCOUNTER — Inpatient Hospital Stay

## 2024-03-28 DIAGNOSIS — D508 Other iron deficiency anemias: Secondary | ICD-10-CM

## 2024-03-28 LAB — CBC WITH DIFFERENTIAL (CANCER CENTER ONLY)
Abs Immature Granulocytes: 0.01 K/uL (ref 0.00–0.07)
Basophils Absolute: 0 K/uL (ref 0.0–0.1)
Basophils Relative: 1 %
Eosinophils Absolute: 0.2 K/uL (ref 0.0–0.5)
Eosinophils Relative: 4 %
HCT: 42.5 % (ref 36.0–46.0)
Hemoglobin: 14.2 g/dL (ref 12.0–15.0)
Immature Granulocytes: 0 %
Lymphocytes Relative: 42 %
Lymphs Abs: 2.3 K/uL (ref 0.7–4.0)
MCH: 28.2 pg (ref 26.0–34.0)
MCHC: 33.4 g/dL (ref 30.0–36.0)
MCV: 84.5 fL (ref 80.0–100.0)
Monocytes Absolute: 0.5 K/uL (ref 0.1–1.0)
Monocytes Relative: 9 %
Neutro Abs: 2.4 K/uL (ref 1.7–7.7)
Neutrophils Relative %: 44 %
Platelet Count: 195 K/uL (ref 150–400)
RBC: 5.03 MIL/uL (ref 3.87–5.11)
RDW: 18.9 % — ABNORMAL HIGH (ref 11.5–15.5)
WBC Count: 5.4 K/uL (ref 4.0–10.5)
nRBC: 0 % (ref 0.0–0.2)

## 2024-03-28 LAB — IRON AND IRON BINDING CAPACITY (CC-WL,HP ONLY)
Iron: 148 ug/dL (ref 28–170)
Saturation Ratios: 36 % — ABNORMAL HIGH (ref 10.4–31.8)
TIBC: 407 ug/dL (ref 250–450)
UIBC: 259 ug/dL

## 2024-03-28 LAB — FERRITIN: Ferritin: 35 ng/mL (ref 11–307)

## 2024-03-28 NOTE — Progress Notes (Unsigned)
 [facility] OFFICE PROGRESS NOTE  Patient Care Team: Clarice Nottingham, MD as PCP - General (Internal Medicine)  Stacy Lawrence is a 63 y.o. female with history of HLD, depression, anxiety being seen for iron deficiency anemia.   Relevant history: History of large hiatal hernia Last colonoscopy: 2021 Last EGD: None recently  Ferritin improved to 35. Hgb normal. Continue iron supplement. Assessment & Plan Iron deficiency anemia secondary to inadequate dietary iron intake Iron supplement twice daily   No orders of the defined types were placed in this encounter.    Stacy JAYSON Chihuahua, MD  INTERVAL HISTORY: Patient returns for follow-up. No bloody stool, dark stool, hematuria or vaginal bleeding.  Previously history 01/01/22 hgb 10.8. MCV 82 RDW 16 Report she took oral iron and no side effects and stopped after numbers improved.   01/16/24 hgb 12 MCV 75. RDW 17 iron saturation 5% Ferritin 3.7  Stacy Lawrence had not noticed any recent bleeding such as melena, hematuria or hematochezia. Her last colonoscopy was 2021 and due in 2026. No thin stool or caliber change.   She was started on iron 325 mg daily on 10/7. Stool turned dark after that.   PHYSICAL EXAMINATION: ECOG PERFORMANCE STATUS: {CHL ONC ECOG PS:[id number]}  There were no vitals filed for this visit. There were no vitals filed for this visit.  GENERAL: alert, no distress and comfortable SKIN: skin color normal and no jaundice or bruising or petechiae on exposed skin EYES: normal, sclera clear OROPHARYNX: no exudate  NECK: No palpable mass LYMPH:  no palpable cervical, axillary lymphadenopathy  LUNGS: clear to auscultation and no wheeze or rales with normal breathing effort HEART: regular rate & rhythm  ABDOMEN: abdomen soft, non-tender and nondistended. Musculoskeletal: no edema NEURO: no focal motor/sensory deficits  Relevant data reviewed during this visit included labs.  New labs ordered.

## 2024-03-29 ENCOUNTER — Ambulatory Visit: Payer: Self-pay

## 2024-03-29 DIAGNOSIS — D509 Iron deficiency anemia, unspecified: Secondary | ICD-10-CM | POA: Insufficient documentation

## 2024-03-29 NOTE — Assessment & Plan Note (Addendum)
Iron supplement twice daily

## 2024-04-01 ENCOUNTER — Inpatient Hospital Stay

## 2024-04-01 VITALS — BP 102/69 | HR 73 | Temp 97.5°F | Resp 16 | Ht 64.0 in | Wt 155.3 lb

## 2024-04-01 DIAGNOSIS — D508 Other iron deficiency anemias: Secondary | ICD-10-CM

## 2024-09-20 ENCOUNTER — Inpatient Hospital Stay

## 2024-09-23 ENCOUNTER — Inpatient Hospital Stay
